# Patient Record
Sex: Male | Born: 1990 | Race: White | Hispanic: No | Marital: Single | Smoking: Current every day smoker
Health system: Southern US, Community
[De-identification: ages and names within clinical notes are randomized; demographics above are authoritative.]

## PROBLEM LIST (undated history)

## (undated) DIAGNOSIS — F419 Anxiety disorder, unspecified: Secondary | ICD-10-CM

## (undated) DIAGNOSIS — F410 Panic disorder [episodic paroxysmal anxiety] without agoraphobia: Secondary | ICD-10-CM

## (undated) DIAGNOSIS — F4310 Post-traumatic stress disorder, unspecified: Secondary | ICD-10-CM

## (undated) HISTORY — PX: ABDOMINAL SURGERY: SHX537

## (undated) HISTORY — PX: OTHER SURGICAL HISTORY: SHX169

## (undated) HISTORY — PX: NOSE SURGERY: SHX723

---

## 2008-05-30 ENCOUNTER — Inpatient Hospital Stay (HOSPITAL_COMMUNITY): Admission: EM | Admit: 2008-05-30 | Discharge: 2008-06-04 | Payer: Self-pay | Admitting: Emergency Medicine

## 2009-02-13 ENCOUNTER — Ambulatory Visit: Payer: Self-pay | Admitting: Interventional Radiology

## 2009-02-13 ENCOUNTER — Emergency Department (HOSPITAL_BASED_OUTPATIENT_CLINIC_OR_DEPARTMENT_OTHER): Admission: EM | Admit: 2009-02-13 | Discharge: 2009-02-13 | Payer: Self-pay | Admitting: Emergency Medicine

## 2009-05-03 ENCOUNTER — Emergency Department (HOSPITAL_COMMUNITY): Admission: EM | Admit: 2009-05-03 | Discharge: 2009-05-03 | Payer: Self-pay | Admitting: Emergency Medicine

## 2009-05-05 ENCOUNTER — Observation Stay (HOSPITAL_COMMUNITY): Admission: EM | Admit: 2009-05-05 | Discharge: 2009-05-06 | Payer: Self-pay | Admitting: Emergency Medicine

## 2009-09-30 ENCOUNTER — Emergency Department (HOSPITAL_COMMUNITY): Admission: EM | Admit: 2009-09-30 | Discharge: 2009-09-30 | Payer: Self-pay | Admitting: Emergency Medicine

## 2010-01-25 ENCOUNTER — Emergency Department (HOSPITAL_COMMUNITY)
Admission: EM | Admit: 2010-01-25 | Discharge: 2010-01-25 | Payer: Self-pay | Source: Home / Self Care | Admitting: Emergency Medicine

## 2010-03-04 ENCOUNTER — Emergency Department (HOSPITAL_COMMUNITY)
Admission: EM | Admit: 2010-03-04 | Discharge: 2010-03-04 | Disposition: A | Discharge status: Home / Self Care | Payer: Self-pay | Attending: Emergency Medicine | Admitting: Emergency Medicine

## 2010-03-04 DIAGNOSIS — K047 Periapical abscess without sinus: Secondary | ICD-10-CM | POA: Insufficient documentation

## 2010-03-04 DIAGNOSIS — K089 Disorder of teeth and supporting structures, unspecified: Secondary | ICD-10-CM | POA: Insufficient documentation

## 2010-03-24 LAB — CBC
MCHC: 35.4 g/dL (ref 30.0–36.0)
MCV: 92.4 fL (ref 78.0–100.0)
RDW: 13.1 % (ref 11.5–15.5)
RDW: 13.5 % (ref 11.5–15.5)
WBC: 5.3 10*3/uL (ref 4.0–10.5)
WBC: 8 10*3/uL (ref 4.0–10.5)

## 2010-03-24 LAB — DIFFERENTIAL
Basophils Absolute: 0 10*3/uL (ref 0.0–0.1)
Basophils Relative: 1 % (ref 0–1)
Basophils Relative: 0 % (ref 0–1)
Eosinophils Absolute: 0.1 10*3/uL (ref 0.0–0.7)
Eosinophils Relative: 2 % (ref 0–5)
Eosinophils Relative: 1 % (ref 0–5)
Lymphocytes Relative: 34 % (ref 12–46)
Monocytes Absolute: 0.6 10*3/uL (ref 0.1–1.0)
Monocytes Relative: 11 % (ref 3–12)
Neutrophils Relative %: 73 % (ref 43–77)

## 2010-03-24 LAB — BASIC METABOLIC PANEL
BUN: 12 mg/dL (ref 6–23)
Calcium: 9.4 mg/dL (ref 8.4–10.5)
Creatinine, Ser: 1.15 mg/dL (ref 0.4–1.5)
GFR calc non Af Amer: >60 mL/min (ref >60)
Potassium: 3.7 mEq/L (ref 3.5–5.1)
Sodium: 135 mEq/L (ref 135–145)

## 2010-04-13 LAB — CBC
Hemoglobin: 12.1 g/dL — ABNORMAL LOW (ref 13.0–17.0)
MCHC: 34.7 g/dL (ref 30.0–36.0)
MCV: 89.6 fL (ref 78.0–100.0)
RBC: 3.88 MIL/uL — ABNORMAL LOW (ref 4.22–5.81)
WBC: 6.7 10*3/uL (ref 4.0–10.5)

## 2010-04-14 LAB — CBC
HCT: 45 % (ref 39.0–52.0)
Hemoglobin: 14.7 g/dL (ref 13.0–17.0)
MCHC: 34.5 g/dL (ref 30.0–36.0)
MCV: 89.9 fL (ref 78.0–100.0)
RBC: 5.01 MIL/uL (ref 4.22–5.81)
RDW: 12.2 % (ref 11.5–15.5)
RDW: 12.6 % (ref 11.5–15.5)
WBC: 9.9 10*3/uL (ref 4.0–10.5)

## 2010-04-14 LAB — COMPREHENSIVE METABOLIC PANEL
ALT: 37 U/L (ref 0–53)
AST: 48 U/L — ABNORMAL HIGH (ref 0–37)
Alkaline Phosphatase: 87 U/L (ref 39–117)
Calcium: 9.2 mg/dL (ref 8.4–10.5)
GFR calc non Af Amer: >60 mL/min (ref >60)
Glucose, Bld: 145 mg/dL — ABNORMAL HIGH (ref 70–99)
Potassium: 3.2 mEq/L — ABNORMAL LOW (ref 3.5–5.1)
Total Protein: 6.6 g/dL (ref 6.0–8.3)

## 2010-04-14 LAB — DIFFERENTIAL
Basophils Absolute: 0.1 10*3/uL (ref 0.0–0.1)
Eosinophils Absolute: 0.2 10*3/uL (ref 0.0–0.7)
Lymphocytes Relative: 54 % — ABNORMAL HIGH (ref 12–46)
Monocytes Relative: 9 % (ref 3–12)
Neutro Abs: 3.5 10*3/uL (ref 1.7–7.7)
Neutrophils Relative %: 35 % — ABNORMAL LOW (ref 43–77)

## 2010-04-14 LAB — ABO/RH: ABO/RH(D): O POS

## 2010-04-14 LAB — POCT I-STAT, CHEM 8
Chloride: 104 mEq/L (ref 96–112)
Glucose, Bld: 144 mg/dL — ABNORMAL HIGH (ref 70–99)
Sodium: 141 mEq/L (ref 135–145)

## 2010-04-14 LAB — BASIC METABOLIC PANEL
CO2: 27 mEq/L (ref 19–32)
Calcium: 9.4 mg/dL (ref 8.4–10.5)
Creatinine, Ser: 0.78 mg/dL (ref 0.4–1.5)
GFR calc Af Amer: >60 mL/min (ref >60)
GFR calc non Af Amer: >60 mL/min (ref >60)
Glucose, Bld: 110 mg/dL — ABNORMAL HIGH (ref 70–99)
Sodium: 135 mEq/L (ref 135–145)

## 2010-04-14 LAB — CROSSMATCH
ABO/RH(D): O POS
Antibody Screen: NEGATIVE

## 2010-05-05 ENCOUNTER — Emergency Department (HOSPITAL_COMMUNITY)
Admission: EM | Admit: 2010-05-05 | Discharge: 2010-05-05 | Disposition: A | Discharge status: Home / Self Care | Payer: Self-pay | Attending: Emergency Medicine | Admitting: Emergency Medicine

## 2010-05-05 DIAGNOSIS — M25519 Pain in unspecified shoulder: Secondary | ICD-10-CM | POA: Insufficient documentation
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
## 2010-06-19 ENCOUNTER — Emergency Department (HOSPITAL_COMMUNITY)
Admission: EM | Admit: 2010-06-19 | Discharge: 2010-06-19 | Disposition: A | Discharge status: Home / Self Care | Payer: Self-pay | Attending: Emergency Medicine | Admitting: Emergency Medicine

## 2010-06-19 DIAGNOSIS — T2102XA Burn of unspecified degree of abdominal wall, initial encounter: Secondary | ICD-10-CM | POA: Insufficient documentation

## 2010-06-19 DIAGNOSIS — T23079A Burn of unspecified degree of unspecified wrist, initial encounter: Secondary | ICD-10-CM | POA: Insufficient documentation

## 2010-06-19 DIAGNOSIS — X12XXXA Contact with other hot fluids, initial encounter: Secondary | ICD-10-CM | POA: Insufficient documentation

## 2010-09-12 ENCOUNTER — Emergency Department (HOSPITAL_COMMUNITY)
Admission: EM | Admit: 2010-09-12 | Discharge: 2010-09-12 | Disposition: A | Discharge status: Home / Self Care | Payer: Self-pay | Attending: Emergency Medicine | Admitting: Emergency Medicine

## 2010-09-12 ENCOUNTER — Encounter: Payer: Self-pay | Admitting: *Deleted

## 2010-09-12 DIAGNOSIS — S161XXA Strain of muscle, fascia and tendon at neck level, initial encounter: Secondary | ICD-10-CM

## 2010-09-12 DIAGNOSIS — M542 Cervicalgia: Secondary | ICD-10-CM | POA: Insufficient documentation

## 2010-09-12 MED ORDER — IBUPROFEN 400 MG PO TABS
600.0000 mg | ORAL_TABLET | Freq: Once | ORAL | Status: AC
  Administered 2010-09-12: 600 mg via ORAL
  Filled 2010-09-12: qty 2

## 2010-09-12 MED ORDER — NAPROXEN 500 MG PO TABS: 500.0000 mg | ORAL_TABLET | Freq: Two times a day (BID) | ORAL | Status: DC

## 2010-09-12 MED ORDER — NAPROXEN 250 MG PO TABS: 500.0000 mg | ORAL_TABLET | Freq: Once | ORAL | Status: DC

## 2010-09-12 NOTE — ED Provider Notes (Signed)
History     CSN: [id number] Arrival date & time: 09/12/2010  1:21 AM  Chief Complaint  Patient presents with  . Neck Pain   Patient is a 20 y.o. male presenting with neck pain. The history is provided by the patient.  Neck Pain  This is a new problem. The current episode started 2 days ago. The problem occurs constantly. The problem has not changed since onset.The pain is associated with an unknown factor. There has been no fever. The pain is present in the generalized neck. The quality of the pain is described as aching. The pain does not radiate. The pain is at a severity of 8/10. The pain is moderate. The symptoms are aggravated by position. The pain is worse during the night. Pertinent negatives include no chest pain, no numbness, no headaches, no paresis, no tingling and no weakness.    History reviewed. No pertinent past medical history.  Past Surgical History  Procedure Date  . Abdominal surgery   . Nose surgery     History reviewed. No pertinent family history.  History  Substance Use Topics  . Smoking status: Current Everyday Smoker  . Smokeless tobacco: Not on file  . Alcohol Use: No      Review of Systems  Constitutional: Negative for fever.  HENT: Positive for neck pain. Negative for tinnitus.   Eyes: Negative for pain.  Respiratory: Negative for shortness of breath.   Cardiovascular: Negative for chest pain.  Gastrointestinal: Negative for abdominal pain.  Genitourinary: Negative for hematuria.  Musculoskeletal: Negative for myalgias, back pain and arthralgias.  Skin: Negative for rash.  Neurological: Negative for tingling, weakness, numbness and headaches.  Hematological: Negative for adenopathy.    Physical Exam  BP 120/79  Pulse 77  Temp(Src) 98.3 F (36.8 C) (Oral)  Resp 18  Ht 5\' 10"  (1.778 m)  Wt 140 lb (63.504 kg)  BMI 20.09 kg/m2  SpO2 99%  Physical Exam  Nursing note and vitals reviewed. Constitutional: He is oriented to person, place,  and time. He appears well-developed and well-nourished. No distress.  HENT:  Head: Normocephalic and atraumatic.  Mouth/Throat: Oropharynx is clear and moist.  Eyes: Conjunctivae and EOM are normal. Pupils are equal, round, and reactive to light.  Neck: Normal range of motion. Neck supple. No tracheal deviation present.  Cardiovascular: Normal rate, regular rhythm and normal heart sounds.   Pulmonary/Chest: Effort normal and breath sounds normal.  Abdominal: Soft. Bowel sounds are normal.  Musculoskeletal: Normal range of motion.  Lymphadenopathy:    He has no cervical adenopathy.  Neurological: He is alert and oriented to person, place, and time. No cranial nerve deficit.  Skin: Skin is warm. No rash noted.    ED Course  Procedures  MDM SUSPECT MILD MUSCLE STRAIN NO RECENT INJURY. PAIN MOSTLY POST NECK BIALT.       Shelda Jakes, MD 09/12/10 [phone]

## 2010-09-12 NOTE — ED Notes (Signed)
Pt c/o neck pain; pt denies any injury; pt c/o nausea

## 2010-10-09 ENCOUNTER — Encounter (HOSPITAL_COMMUNITY): Payer: Self-pay | Admitting: *Deleted

## 2010-10-09 ENCOUNTER — Emergency Department (HOSPITAL_COMMUNITY)
Admission: EM | Admit: 2010-10-09 | Discharge: 2010-10-09 | Disposition: A | Discharge status: Home / Self Care | Payer: Self-pay | Attending: Emergency Medicine | Admitting: Emergency Medicine

## 2010-10-09 DIAGNOSIS — R0602 Shortness of breath: Secondary | ICD-10-CM | POA: Insufficient documentation

## 2010-10-09 DIAGNOSIS — F4310 Post-traumatic stress disorder, unspecified: Secondary | ICD-10-CM | POA: Insufficient documentation

## 2010-10-09 DIAGNOSIS — F17200 Nicotine dependence, unspecified, uncomplicated: Secondary | ICD-10-CM | POA: Insufficient documentation

## 2010-10-09 DIAGNOSIS — F410 Panic disorder [episodic paroxysmal anxiety] without agoraphobia: Secondary | ICD-10-CM | POA: Insufficient documentation

## 2010-10-09 HISTORY — DX: Post-traumatic stress disorder, unspecified: F43.10

## 2010-10-09 HISTORY — DX: Anxiety disorder, unspecified: F41.9

## 2010-10-09 HISTORY — DX: Panic disorder (episodic paroxysmal anxiety): F41.0
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
## 2010-10-09 NOTE — ED Notes (Signed)
Pt also feels jittery.

## 2010-10-09 NOTE — ED Notes (Signed)
Pt states he is feeling better at this time & ready to go. EDP notified & advised he will get the discharge papers.
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
## 2011-01-08 ENCOUNTER — Encounter (HOSPITAL_COMMUNITY): Payer: Self-pay | Admitting: *Deleted

## 2011-01-08 ENCOUNTER — Emergency Department (HOSPITAL_COMMUNITY): Payer: Self-pay

## 2011-01-08 ENCOUNTER — Emergency Department (HOSPITAL_COMMUNITY)
Admission: EM | Admit: 2011-01-08 | Discharge: 2011-01-08 | Disposition: A | Discharge status: Home / Self Care | Payer: Self-pay | Attending: Emergency Medicine | Admitting: Emergency Medicine

## 2011-01-08 DIAGNOSIS — W19XXXA Unspecified fall, initial encounter: Secondary | ICD-10-CM

## 2011-01-08 DIAGNOSIS — M542 Cervicalgia: Secondary | ICD-10-CM | POA: Insufficient documentation

## 2011-01-08 DIAGNOSIS — Z9889 Other specified postprocedural states: Secondary | ICD-10-CM | POA: Insufficient documentation

## 2011-01-08 DIAGNOSIS — M545 Low back pain, unspecified: Secondary | ICD-10-CM | POA: Insufficient documentation

## 2011-01-08 DIAGNOSIS — F17200 Nicotine dependence, unspecified, uncomplicated: Secondary | ICD-10-CM | POA: Insufficient documentation

## 2011-01-08 DIAGNOSIS — R51 Headache: Secondary | ICD-10-CM | POA: Insufficient documentation

## 2011-01-08 DIAGNOSIS — Y9329 Activity, other involving ice and snow: Secondary | ICD-10-CM | POA: Insufficient documentation

## 2011-01-08 DIAGNOSIS — R110 Nausea: Secondary | ICD-10-CM | POA: Insufficient documentation

## 2011-01-08 DIAGNOSIS — M5380 Other specified dorsopathies, site unspecified: Secondary | ICD-10-CM | POA: Insufficient documentation

## 2011-01-08 DIAGNOSIS — IMO0001 Reserved for inherently not codable concepts without codable children: Secondary | ICD-10-CM | POA: Insufficient documentation

## 2011-01-08 DIAGNOSIS — R269 Unspecified abnormalities of gait and mobility: Secondary | ICD-10-CM | POA: Insufficient documentation

## 2011-01-08 DIAGNOSIS — M62838 Other muscle spasm: Secondary | ICD-10-CM | POA: Insufficient documentation

## 2011-01-08 DIAGNOSIS — W108XXA Fall (on) (from) other stairs and steps, initial encounter: Secondary | ICD-10-CM | POA: Insufficient documentation

## 2011-01-08 DIAGNOSIS — M546 Pain in thoracic spine: Secondary | ICD-10-CM | POA: Insufficient documentation

## 2011-01-08 DIAGNOSIS — F411 Generalized anxiety disorder: Secondary | ICD-10-CM | POA: Insufficient documentation

## 2011-01-08 DIAGNOSIS — M62830 Muscle spasm of back: Secondary | ICD-10-CM

## 2011-01-08 MED ORDER — IBUPROFEN 800 MG PO TABS: 800.0000 mg | ORAL_TABLET | Freq: Once | ORAL | Status: DC

## 2011-01-08 MED ORDER — IBUPROFEN 800 MG PO TABS
800.0000 mg | ORAL_TABLET | Freq: Once | ORAL | Status: DC
  Filled 2011-01-08: qty 1

## 2011-01-08 MED ORDER — CYCLOBENZAPRINE HCL 10 MG PO TABS: 10.0000 mg | ORAL_TABLET | Freq: Two times a day (BID) | ORAL | Status: AC | PRN

## 2011-01-08 NOTE — ED Notes (Addendum)
Slipped on ice coming down outdoor steps. C/o neck, back and tailbone pain. c-collar applied in triage. Also mentions HA & nausea. (Denies: other pains, injuries or sx). Denies LOC or vomiting. No meds PTA. Also states, "i have a h/o chronic neck and back issues & pain, but have been unable to get them checked out".

## 2011-01-08 NOTE — ED Provider Notes (Signed)
Patient reports he slipped and fell down approximately 3-4 steps last night at approximately 10:30 or 11:00 prior to arrival. Patient now complaining of bony tenderness to C-spine, T-spine, and L-spine. Patient denies drug or alcohol use prior to fall. Moves all extremities x4, denies numbness or tingling.  There are no visible signs of trauma to patient's back. Will obtain imaging of C-spine, T-spine and L-spine and reevaluate. B BS CTA, abdomen soft and nontender without signs of trauma.  Leanne Chang, NP 01/08/11 [phone]  Medical screening examination/treatment/procedure(s) were performed by non-physician practitioner and as supervising physician I was immediately available for consultation/collaboration.   Sunnie Nielsen, MD 01/12/11 0930
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
## 2011-01-13 ENCOUNTER — Encounter (HOSPITAL_COMMUNITY): Payer: Self-pay | Admitting: *Deleted

## 2011-01-13 ENCOUNTER — Emergency Department (HOSPITAL_COMMUNITY)
Admission: EM | Admit: 2011-01-13 | Discharge: 2011-01-13 | Disposition: A | Discharge status: Home / Self Care | Payer: Self-pay | Attending: Emergency Medicine | Admitting: Emergency Medicine

## 2011-01-13 DIAGNOSIS — F17200 Nicotine dependence, unspecified, uncomplicated: Secondary | ICD-10-CM | POA: Insufficient documentation

## 2011-01-13 DIAGNOSIS — F410 Panic disorder [episodic paroxysmal anxiety] without agoraphobia: Secondary | ICD-10-CM | POA: Insufficient documentation

## 2011-01-13 DIAGNOSIS — F411 Generalized anxiety disorder: Secondary | ICD-10-CM | POA: Insufficient documentation

## 2011-01-13 DIAGNOSIS — K0889 Other specified disorders of teeth and supporting structures: Secondary | ICD-10-CM

## 2011-01-13 DIAGNOSIS — K029 Dental caries, unspecified: Secondary | ICD-10-CM | POA: Insufficient documentation

## 2011-01-13 DIAGNOSIS — R112 Nausea with vomiting, unspecified: Secondary | ICD-10-CM | POA: Insufficient documentation

## 2011-01-13 DIAGNOSIS — F4310 Post-traumatic stress disorder, unspecified: Secondary | ICD-10-CM | POA: Insufficient documentation

## 2011-01-13 DIAGNOSIS — K089 Disorder of teeth and supporting structures, unspecified: Secondary | ICD-10-CM | POA: Insufficient documentation

## 2011-01-13 MED ORDER — ONDANSETRON 8 MG PO TBDP
8.0000 mg | ORAL_TABLET | Freq: Once | ORAL | Status: AC
  Administered 2011-01-13: 8 mg via ORAL
  Filled 2011-01-13: qty 1

## 2011-01-13 MED ORDER — AMOXICILLIN 500 MG PO CAPS: 500.0000 mg | ORAL_CAPSULE | Freq: Three times a day (TID) | ORAL | Status: AC

## 2011-01-13 MED ORDER — HYDROCODONE-ACETAMINOPHEN 5-325 MG PO TABS: ORAL_TABLET | ORAL | Status: AC

## 2011-01-13 MED ORDER — HYDROCODONE-ACETAMINOPHEN 5-325 MG PO TABS
1.0000 | ORAL_TABLET | Freq: Once | ORAL | Status: AC
  Administered 2011-01-13: 1 via ORAL
  Filled 2011-01-13: qty 1

## 2011-01-13 MED ORDER — AMOXICILLIN 250 MG PO CAPS
500.0000 mg | ORAL_CAPSULE | Freq: Once | ORAL | Status: AC
  Administered 2011-01-13: 500 mg via ORAL
  Filled 2011-01-13: qty 2

## 2011-01-13 NOTE — ED Notes (Signed)
States he hasn't been able to keep anything down today

## 2011-01-13 NOTE — ED Notes (Signed)
Rt jaw pain, onset this am., sore throat.  Alert, NAD, visitor at bedside.

## 2011-01-13 NOTE — ED Provider Notes (Signed)
History     CSN: [id number]  Arrival date & time 01/13/11  2008   None     Chief Complaint  Patient presents with  . Dental Pain    (Consider location/radiation/quality/duration/timing/severity/associated sxs/prior treatment) HPI Comments: Patient c/o toothace for several days.  Pain became worse on the day of ED arrival.  [place] the pain has made him nauseated and reports few vomiting of vomiting but states he has been able to keep down some fluids today.  He denies facial swelling or abdominal pain  Patient is a 21 y.o. male presenting with tooth pain. The history is provided by the patient.  Dental PainThe primary symptoms include mouth pain. Primary symptoms do not include oral bleeding, headaches, fever, shortness of breath, sore throat, angioedema or cough. The symptoms began 2 days ago. The symptoms are unchanged. The symptoms are new. The symptoms occur constantly.  Mouth pain occurs constantly. Mouth pain is worsening. Affected locations include: gum(s) and teeth. The mouth pain is currently at 9/10.  Additional symptoms include: dental sensitivity to temperature and gum tenderness. Additional symptoms do not include: gum swelling, purulent gums, trismus, jaw pain, facial swelling, trouble swallowing, pain with swallowing and drooling. Associated symptoms comments: Nausea and intermittent vomiting. Medical issues include: periodontal disease.    Past Medical History  Diagnosis Date  . Anxiety   . Panic attack   . PTSD (post-traumatic stress disorder)     Past Surgical History  Procedure Date  . Abdominal surgery   . Nose surgery     Family History  Problem Relation Age of Onset  . Diabetes Father   . Hypertension Father   . Diabetes Maternal Uncle     History  Substance Use Topics  . Smoking status: Current Everyday Smoker -- 1.0 packs/day  . Smokeless tobacco: Not on file  . Alcohol Use: Yes     occaisional      Review of Systems  Constitutional: Negative  for fever and chills.  HENT: Positive for dental problem. Negative for sore throat, facial swelling, drooling, trouble swallowing, neck pain and neck stiffness.   Respiratory: Negative for cough, shortness of breath and wheezing.   Cardiovascular: Negative for chest pain and palpitations.  Gastrointestinal: Positive for nausea and vomiting. Negative for abdominal pain and diarrhea.  Musculoskeletal: Negative for myalgias, back pain and arthralgias.  Skin: Negative.  Negative for rash.  Neurological: Negative for dizziness, weakness, numbness and headaches.  Hematological: Negative for adenopathy. Does not bruise/bleed easily.  All other systems reviewed and are negative.    Allergies  Codeine and Ibuprofen  Home Medications   Current Outpatient Rx  Name Route Sig Dispense Refill  . CYCLOBENZAPRINE HCL 10 MG PO TABS Oral Take 1 tablet (10 mg total) by mouth 2 (two) times daily as needed for muscle spasms. 20 tablet 0  . GOODY PM PO Oral Take 1 packet by mouth as needed. For dental pain    . IBUPROFEN 200 MG PO TABS Oral Take 200 mg by mouth 2 (two) times daily as needed. For pain      BP 110/65  Pulse 75  Temp 98.2 F (36.8 C)  Resp 20  Ht 5\' 10"  (1.778 m)  Wt 140 lb (63.504 kg)  BMI 20.09 kg/m2  SpO2 97%  Physical Exam  Nursing note and vitals reviewed. Constitutional: He is oriented to person, place, and time. He appears well-developed and well-nourished. No distress.  HENT:  Head: Normocephalic and atraumatic.  Mouth/Throat: Uvula is midline,  oropharynx is clear and moist and mucous membranes are normal. Dental caries present.  Neck: Normal range of motion.  Cardiovascular: Normal rate, regular rhythm and normal heart sounds.   Pulmonary/Chest: Effort normal and breath sounds normal. No respiratory distress.  Abdominal: Soft. He exhibits no distension. There is no tenderness.  Musculoskeletal: Normal range of motion. He exhibits no tenderness.  Lymphadenopathy:    He  has no cervical adenopathy.  Neurological: He is alert and oriented to person, place, and time. No cranial nerve deficit. He exhibits normal muscle tone. Coordination normal.  Skin: Skin is warm and dry.    ED Course  Procedures (including critical care time)  Labs Reviewed - No data to display No results found.   No diagnosis found.    MDM    Multiple dental caries.  Discoloration of the right lower third molar and moderate erythema and edema of the gums surrounding the upper right second and molar.  No trismus or facial swelling.  No abd tenderness on exam, mucous membranes are moist, no active vomiting.    Patient / Family / Caregiver understand and agree with initial ED impression and plan with expectations set for ED visit.   Pt stable in ED with no significant deterioration in condition.         Arriyah Madej L. Trisha Mangle, [place] 01/16/11 2014

## 2011-01-13 NOTE — ED Notes (Signed)
Dental pain right jaw 

## 2011-01-18 NOTE — ED Provider Notes (Signed)
Medical screening examination/treatment/procedure(s) were performed by non-physician practitioner and as supervising physician I was immediately available for consultation/collaboration.   Benny Lennert, MD 01/18/11 [phone]

## 2011-03-06 ENCOUNTER — Emergency Department (HOSPITAL_COMMUNITY)
Admission: EM | Admit: 2011-03-06 | Discharge: 2011-03-06 | Disposition: A | Discharge status: Home Health | Payer: Self-pay | Attending: Emergency Medicine | Admitting: Emergency Medicine

## 2011-03-06 ENCOUNTER — Encounter (HOSPITAL_COMMUNITY): Payer: Self-pay | Admitting: *Deleted

## 2011-03-06 ENCOUNTER — Emergency Department (HOSPITAL_COMMUNITY): Payer: Self-pay

## 2011-03-06 DIAGNOSIS — F411 Generalized anxiety disorder: Secondary | ICD-10-CM | POA: Insufficient documentation

## 2011-03-06 DIAGNOSIS — R10819 Abdominal tenderness, unspecified site: Secondary | ICD-10-CM | POA: Insufficient documentation

## 2011-03-06 DIAGNOSIS — R109 Unspecified abdominal pain: Secondary | ICD-10-CM | POA: Insufficient documentation

## 2011-03-06 DIAGNOSIS — Z9889 Other specified postprocedural states: Secondary | ICD-10-CM | POA: Insufficient documentation

## 2011-03-06 DIAGNOSIS — F17200 Nicotine dependence, unspecified, uncomplicated: Secondary | ICD-10-CM | POA: Insufficient documentation

## 2011-03-06 LAB — CBC
HCT: 39.4 % (ref 39.0–52.0)
Hemoglobin: 14.1 g/dL (ref 13.0–17.0)
MCH: 30.8 pg (ref 26.0–34.0)
MCHC: 35.8 g/dL (ref 30.0–36.0)
MCV: 86 fL (ref 78.0–100.0)
Platelets: 275 10*3/uL (ref 150–400)
RBC: 4.58 MIL/uL (ref 4.22–5.81)
RDW: 12.4 % (ref 11.5–15.5)
WBC: 10.4 10*3/uL (ref 4.0–10.5)

## 2011-03-06 LAB — COMPREHENSIVE METABOLIC PANEL
ALT: 14 U/L (ref 0–53)
AST: 19 U/L (ref 0–37)
Albumin: 4.2 g/dL (ref 3.5–5.2)
Alkaline Phosphatase: 75 U/L (ref 39–117)
BUN: 14 mg/dL (ref 6–23)
CO2: 26 mEq/L (ref 19–32)
Calcium: 9.9 mg/dL (ref 8.4–10.5)
Chloride: 104 mEq/L (ref 96–112)
Creatinine, Ser: 0.75 mg/dL (ref 0.50–1.35)
GFR calc Af Amer: >90 mL/min (ref >90)
GFR calc non Af Amer: >90 mL/min (ref >90)
Glucose, Bld: 86 mg/dL (ref 70–99)
Potassium: 4.2 mEq/L (ref 3.5–5.1)
Sodium: 140 mEq/L (ref 135–145)
Total Bilirubin: 0.9 mg/dL (ref 0.3–1.2)
Total Protein: 7.1 g/dL (ref 6.0–8.3)

## 2011-03-06 LAB — URINALYSIS, ROUTINE W REFLEX MICROSCOPIC
Bilirubin Urine: NEGATIVE
Glucose, UA: NEGATIVE mg/dL
Hgb urine dipstick: NEGATIVE
Ketones, ur: NEGATIVE mg/dL
Leukocytes, UA: NEGATIVE
Nitrite: NEGATIVE
Protein, ur: NEGATIVE mg/dL
Specific Gravity, Urine: 1.007 (ref 1.005–1.030)
Urobilinogen, UA: 0.2 mg/dL (ref 0.0–1.0)
pH: 6.5 (ref 5.0–8.0)

## 2011-03-06 LAB — LIPASE, BLOOD: Lipase: 21 U/L (ref 11–59)

## 2011-03-06 LAB — GLUCOSE, CAPILLARY: Glucose-Capillary: 112 mg/dL — ABNORMAL HIGH (ref 70–99)

## 2011-03-06 MED ORDER — IOHEXOL 300 MG/ML  SOLN
100.0000 mL | Freq: Once | INTRAMUSCULAR | Status: AC | PRN
  Administered 2011-03-06: 100 mL via INTRAVENOUS

## 2011-03-06 MED ORDER — SODIUM CHLORIDE 0.9 % IV BOLUS (SEPSIS)
1000.0000 mL | Freq: Once | INTRAVENOUS | Status: AC
  Administered 2011-03-06: 1000 mL via INTRAVENOUS

## 2011-03-06 MED ORDER — HYDROMORPHONE HCL PF 1 MG/ML IJ SOLN
1.0000 mg | Freq: Once | INTRAMUSCULAR | Status: AC
  Administered 2011-03-06: 1 mg via INTRAVENOUS
  Filled 2011-03-06: qty 1

## 2011-03-06 MED ORDER — IOHEXOL 300 MG/ML  SOLN: 20.0000 mL | INTRAMUSCULAR | Status: AC

## 2011-03-06 MED ORDER — ONDANSETRON HCL 4 MG/2ML IJ SOLN
4.0000 mg | Freq: Once | INTRAMUSCULAR | Status: AC
  Administered 2011-03-06: 4 mg via INTRAVENOUS
  Filled 2011-03-06: qty 2

## 2011-03-06 NOTE — ED Notes (Signed)
CBG was 112 when I checked it.

## 2011-03-06 NOTE — ED Notes (Signed)
Patient transported to CT 

## 2011-03-06 NOTE — ED Notes (Signed)
Pt began vomiting after receiving IV Dye contrast

## 2011-03-06 NOTE — Discharge Instructions (Signed)
Abdominal Pain Abdominal pain can be caused by many things. Your caregiver decides the seriousness of your pain by an examination and possibly blood tests and X-rays. Many cases can be observed and treated at home. Most abdominal pain is not caused by a disease and will probably improve without treatment. However, in many cases, more time must pass before a clear cause of the pain can be found. Before that point, it may not be known if you need more testing, or if hospitalization or surgery is needed. HOME CARE INSTRUCTIONS   Do not take laxatives unless directed by your caregiver.   Take pain medicine only as directed by your caregiver.   Only take over-the-counter or prescription medicines for pain, discomfort, or fever as directed by your caregiver.   Try a clear liquid diet (broth, tea, or water) for as long as directed by your caregiver. Slowly move to a bland diet as tolerated.  SEEK IMMEDIATE MEDICAL CARE IF:   The pain does not go away.   You have a fever.   You keep throwing up (vomiting).   The pain is felt only in portions of the abdomen. Pain in the right side could possibly be appendicitis. In an adult, pain in the left lower portion of the abdomen could be colitis or diverticulitis.   You pass bloody or black tarry stools.  MAKE SURE YOU:   Understand these instructions.   Will watch your condition.   Will get help right away if you are not doing well or get worse.  Document Released: 09/30/2004 Document Revised: 09/02/2010 Document Reviewed: 08/09/2007 [facility] Patient Information 2012 [place], [place].  RESOURCE GUIDE  Dental Problems  Patients with Medicaid: [facility] [phone] [address]                                           [phone] [address] Phone:  [phone]                                                  Phone:  [phone]  If unable to pay or uninsured, contact:  [facility] or [facility]. to become qualified for the adult dental clinic.  Chronic Pain Problems Contact [facility]  [phone] Patients need to be referred by their primary care doctor.  Insufficient Money for Medicine Contact [employer]:  call "211" or [facility] (629)593-3365.  No Primary Care Doctor Call [facility]  [phone] Other agencies that provide inexpensive medical care    [facility]  [phone]    [facility], [facility], [facility]  [phone]    [facility]  [phone]    [facility]  [phone]    [facility]  [phone]    [facility]  [phone]  Psychological Services [facility]  [phone] [facility]  [phone] [facility]   [phone] (emergency services 9257643961)  Substance Abuse Resources [facility]  ([phone] [facility] [phone] [facility] [place] [phone] Floydene Flock [phone] Residential & Outpatient Substance Abuse Program  [phone]  Abuse/Neglect [place] Child Abuse Hotline (336) 641-3795 [place] Child Abuse Hotline 800-378-5315 (After Hours)  Emergency Shelter [facility] (336) 271-5985  [facility] (336) [phone] [facility] [phone]) [phone]  MRSA Hotline #:   832-7006    [place] Resources  [facility] [place]     [employer]                          [place] [facility] [address] [place]                       [address]      [address]  [place]                                                [place]                            [place] Phone:  [phone]                                   Phone:  [phone]                 Phone:  [phone]  [place] [facility] Phone:  [phone]  [place] Child Abuse Hotline (336) 342-1394 (336) 342-3537 (After  Hours)   

## 2011-03-06 NOTE — ED Notes (Signed)
Pt drinking ct contrast 
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
## 2012-04-23 ENCOUNTER — Emergency Department (HOSPITAL_COMMUNITY): Payer: Self-pay

## 2012-04-23 ENCOUNTER — Emergency Department (HOSPITAL_COMMUNITY)
Admission: EM | Admit: 2012-04-23 | Discharge: 2012-04-23 | Disposition: A | Discharge status: Home / Self Care | Payer: Self-pay | Attending: Emergency Medicine | Admitting: Emergency Medicine

## 2012-04-23 ENCOUNTER — Encounter (HOSPITAL_COMMUNITY): Payer: Self-pay | Admitting: Cardiology

## 2012-04-23 DIAGNOSIS — F17200 Nicotine dependence, unspecified, uncomplicated: Secondary | ICD-10-CM | POA: Insufficient documentation

## 2012-04-23 DIAGNOSIS — F4310 Post-traumatic stress disorder, unspecified: Secondary | ICD-10-CM | POA: Insufficient documentation

## 2012-04-23 DIAGNOSIS — F411 Generalized anxiety disorder: Secondary | ICD-10-CM | POA: Insufficient documentation

## 2012-04-23 DIAGNOSIS — F410 Panic disorder [episodic paroxysmal anxiety] without agoraphobia: Secondary | ICD-10-CM | POA: Insufficient documentation

## 2012-04-23 DIAGNOSIS — R319 Hematuria, unspecified: Secondary | ICD-10-CM | POA: Insufficient documentation

## 2012-04-23 DIAGNOSIS — N200 Calculus of kidney: Secondary | ICD-10-CM | POA: Insufficient documentation

## 2012-04-23 LAB — COMPREHENSIVE METABOLIC PANEL
ALT: 27 U/L (ref 0–53)
AST: 25 U/L (ref 0–37)
Albumin: 4.4 g/dL (ref 3.5–5.2)
Alkaline Phosphatase: 82 U/L (ref 39–117)
Calcium: 9.6 mg/dL (ref 8.4–10.5)
GFR calc Af Amer: >90 mL/min (ref >90)
Glucose, Bld: 94 mg/dL (ref 70–99)
Potassium: 3.8 mEq/L (ref 3.5–5.1)
Sodium: 136 mEq/L (ref 135–145)
Total Protein: 7.8 g/dL (ref 6.0–8.3)

## 2012-04-23 LAB — CBC WITH DIFFERENTIAL/PLATELET
Basophils Absolute: 0 10*3/uL (ref 0.0–0.1)
Basophils Relative: 1 % (ref 0–1)
Eosinophils Absolute: 0.1 10*3/uL (ref 0.0–0.7)
Eosinophils Relative: 1 % (ref 0–5)
Lymphs Abs: 2.4 10*3/uL (ref 0.7–4.0)
MCH: 30.7 pg (ref 26.0–34.0)
MCV: 84.5 fL (ref 78.0–100.0)
Neutrophils Relative %: 64 % (ref 43–77)
Platelets: 246 10*3/uL (ref 150–400)
RBC: 5.21 MIL/uL (ref 4.22–5.81)
RDW: 12.7 % (ref 11.5–15.5)

## 2012-04-23 LAB — URINE MICROSCOPIC-ADD ON

## 2012-04-23 LAB — URINALYSIS, ROUTINE W REFLEX MICROSCOPIC
Bilirubin Urine: NEGATIVE
Specific Gravity, Urine: 1.007 (ref 1.005–1.030)
Urobilinogen, UA: 0.2 mg/dL (ref 0.0–1.0)
pH: 8 (ref 5.0–8.0)

## 2012-04-23 MED ORDER — ONDANSETRON HCL 4 MG/2ML IJ SOLN
4.0000 mg | Freq: Four times a day (QID) | INTRAMUSCULAR | Status: DC | PRN
  Administered 2012-04-23: 4 mg via INTRAVENOUS
  Filled 2012-04-23: qty 2

## 2012-04-23 MED ORDER — TAMSULOSIN HCL 0.4 MG PO CAPS: 0.4000 mg | ORAL_CAPSULE | Freq: Two times a day (BID) | ORAL | Status: DC

## 2012-04-23 MED ORDER — KETOROLAC TROMETHAMINE 30 MG/ML IJ SOLN
30.0000 mg | Freq: Four times a day (QID) | INTRAMUSCULAR | Status: DC | PRN
  Administered 2012-04-23: 30 mg via INTRAVENOUS
  Filled 2012-04-23: qty 1

## 2012-04-23 MED ORDER — NAPROXEN 500 MG PO TABS: 500.0000 mg | ORAL_TABLET | Freq: Two times a day (BID) | ORAL | Status: DC

## 2012-04-23 NOTE — ED Notes (Signed)
Dr Miller at bedside. 

## 2012-04-23 NOTE — ED Notes (Signed)
Pt taken to radiology
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
## 2012-04-23 NOTE — ED Notes (Signed)
Pt alert and mentating appropriately upon d/c. Pt given strainer and urinal for straining urine. Pt instructed how to use. Pt leaving with d/c teaching and prescriptions. Pt verbalizes d/c teaching and prescriptions. Pt has no further questions upon d/c. NAD noted upon d/c. Pt ambulatory upon d/c.
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
## 2013-01-25 ENCOUNTER — Encounter (HOSPITAL_COMMUNITY): Payer: Self-pay | Admitting: Emergency Medicine

## 2013-01-25 ENCOUNTER — Emergency Department (HOSPITAL_COMMUNITY)
Admission: EM | Admit: 2013-01-25 | Discharge: 2013-01-25 | Discharge status: Against Medical Advice | Payer: Self-pay | Attending: Emergency Medicine | Admitting: Emergency Medicine

## 2013-01-25 DIAGNOSIS — R070 Pain in throat: Secondary | ICD-10-CM | POA: Insufficient documentation

## 2013-01-25 DIAGNOSIS — F17200 Nicotine dependence, unspecified, uncomplicated: Secondary | ICD-10-CM | POA: Insufficient documentation

## 2013-01-25 DIAGNOSIS — R059 Cough, unspecified: Secondary | ICD-10-CM | POA: Insufficient documentation

## 2013-01-25 DIAGNOSIS — H9209 Otalgia, unspecified ear: Secondary | ICD-10-CM | POA: Insufficient documentation

## 2013-01-25 DIAGNOSIS — R05 Cough: Secondary | ICD-10-CM | POA: Insufficient documentation

## 2013-01-25 NOTE — ED Notes (Signed)
Pt. Left prior to being seen by PA.

## 2013-01-25 NOTE — ED Notes (Addendum)
Generalized cold symtpoms- ear, throat pain. Cough with yellow mucous in morning; having symptoms for a week.

## 2013-08-21 ENCOUNTER — Encounter (HOSPITAL_COMMUNITY): Payer: Self-pay | Admitting: Emergency Medicine

## 2013-08-21 ENCOUNTER — Emergency Department (HOSPITAL_COMMUNITY): Payer: Self-pay

## 2013-08-21 ENCOUNTER — Emergency Department (HOSPITAL_COMMUNITY)
Admission: EM | Admit: 2013-08-21 | Discharge: 2013-08-21 | Discharge status: Against Medical Advice | Payer: Self-pay | Attending: Emergency Medicine | Admitting: Emergency Medicine

## 2013-08-21 DIAGNOSIS — R079 Chest pain, unspecified: Secondary | ICD-10-CM | POA: Insufficient documentation

## 2013-08-21 DIAGNOSIS — R0789 Other chest pain: Secondary | ICD-10-CM | POA: Insufficient documentation

## 2013-08-21 DIAGNOSIS — F17200 Nicotine dependence, unspecified, uncomplicated: Secondary | ICD-10-CM | POA: Insufficient documentation

## 2013-08-21 DIAGNOSIS — F411 Generalized anxiety disorder: Secondary | ICD-10-CM | POA: Insufficient documentation

## 2013-08-21 DIAGNOSIS — F419 Anxiety disorder, unspecified: Secondary | ICD-10-CM

## 2013-08-21 LAB — CBC
HEMATOCRIT: 44.4 % (ref 39.0–52.0)
HEMOGLOBIN: 15.7 g/dL (ref 13.0–17.0)
MCH: 30.7 pg (ref 26.0–34.0)
MCHC: 35.4 g/dL (ref 30.0–36.0)
MCV: 86.9 fL (ref 78.0–100.0)
Platelets: 245 10*3/uL (ref 150–400)
RBC: 5.11 MIL/uL (ref 4.22–5.81)
RDW: 13.3 % (ref 11.5–15.5)
WBC: 8.7 10*3/uL (ref 4.0–10.5)

## 2013-08-21 LAB — COMPREHENSIVE METABOLIC PANEL
ALK PHOS: 84 U/L (ref 39–117)
ALT: 36 U/L (ref 0–53)
ANION GAP: 13 (ref 5–15)
AST: 22 U/L (ref 0–37)
Albumin: 4.5 g/dL (ref 3.5–5.2)
BILIRUBIN TOTAL: 1.8 mg/dL — AB (ref 0.3–1.2)
BUN: 10 mg/dL (ref 6–23)
CO2: 27 meq/L (ref 19–32)
Calcium: 9.7 mg/dL (ref 8.4–10.5)
Chloride: 101 mEq/L (ref 96–112)
Creatinine, Ser: 1.09 mg/dL (ref 0.50–1.35)
GLUCOSE: 105 mg/dL — AB (ref 70–99)
POTASSIUM: 3.7 meq/L (ref 3.7–5.3)
Sodium: 141 mEq/L (ref 137–147)
TOTAL PROTEIN: 7.9 g/dL (ref 6.0–8.3)

## 2013-08-21 LAB — I-STAT TROPONIN, ED: Troponin i, poc: 0 ng/mL (ref 0.00–0.08)
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
## 2013-08-21 NOTE — ED Notes (Signed)
The pt is tired of waiting to be seen.  He has rib pain now not  acutely

## 2013-08-21 NOTE — ED Notes (Signed)
Cp x 1 hour states feels tight states had a panic atack the last time he had chest pain no n/v some sob states hand and legs are numb

## 2013-08-21 NOTE — ED Notes (Signed)
Pt not in the room everything gown etc on the bed  No one there

## 2013-08-21 NOTE — ED Notes (Signed)
The pt was working when he developed chest tightness with sob then in a few minutes he developed arm hand and feet tingling.  He has had an amxiety attack years ago.  Not hyperventilating at present

## 2013-08-23 NOTE — ED Provider Notes (Signed)
Medical screening examination/treatment/procedure(s) were conducted as a shared visit with resident-physician practitioner(s) and myself.  I personally evaluated the patient during the encounter.  Pt is a 23 y.o. male with pmhx as above presenting with atypical chest pain and anxiety.  Pt found to have no acute ischemic changes on EKG.  Pt eloped from dept before could examine him.     EKG Interpretation  Date/Time:  Tuesday August 21 2013 14:07:00 EDT Ventricular Rate:  81 PR Interval:  124 QRS Duration: 80 QT Interval:  358 QTC Calculation: 415 R Axis:   112 Text Interpretation:  Normal sinus rhythm Right axis deviation Abnormal ECG No prior EKG Confirmed by DOCHERTY  MD, MEGAN ([id number]) on 08/21/2013 5:50:06 PM        Toy CookeyMegan Docherty, MD 08/23/13 1120

## 2013-08-25 ENCOUNTER — Emergency Department (HOSPITAL_COMMUNITY)
Admission: EM | Admit: 2013-08-25 | Discharge: 2013-08-25 | Disposition: A | Discharge status: Home / Self Care | Payer: No Typology Code available for payment source | Attending: Emergency Medicine | Admitting: Emergency Medicine

## 2013-08-25 ENCOUNTER — Encounter (HOSPITAL_COMMUNITY): Payer: Self-pay | Admitting: Emergency Medicine

## 2013-08-25 ENCOUNTER — Emergency Department (HOSPITAL_COMMUNITY): Payer: No Typology Code available for payment source

## 2013-08-25 DIAGNOSIS — R42 Dizziness and giddiness: Secondary | ICD-10-CM | POA: Diagnosis not present

## 2013-08-25 DIAGNOSIS — IMO0002 Reserved for concepts with insufficient information to code with codable children: Secondary | ICD-10-CM | POA: Insufficient documentation

## 2013-08-25 DIAGNOSIS — M549 Dorsalgia, unspecified: Secondary | ICD-10-CM

## 2013-08-25 DIAGNOSIS — S01409A Unspecified open wound of unspecified cheek and temporomandibular area, initial encounter: Secondary | ICD-10-CM | POA: Insufficient documentation

## 2013-08-25 DIAGNOSIS — F17200 Nicotine dependence, unspecified, uncomplicated: Secondary | ICD-10-CM | POA: Insufficient documentation

## 2013-08-25 DIAGNOSIS — Y92410 Unspecified street and highway as the place of occurrence of the external cause: Secondary | ICD-10-CM | POA: Insufficient documentation

## 2013-08-25 DIAGNOSIS — S0010XA Contusion of unspecified eyelid and periocular area, initial encounter: Secondary | ICD-10-CM | POA: Diagnosis not present

## 2013-08-25 DIAGNOSIS — S0181XA Laceration without foreign body of other part of head, initial encounter: Secondary | ICD-10-CM

## 2013-08-25 DIAGNOSIS — Y9389 Activity, other specified: Secondary | ICD-10-CM | POA: Diagnosis not present

## 2013-08-25 DIAGNOSIS — S0993XA Unspecified injury of face, initial encounter: Secondary | ICD-10-CM | POA: Insufficient documentation

## 2013-08-25 DIAGNOSIS — S0083XA Contusion of other part of head, initial encounter: Secondary | ICD-10-CM

## 2013-08-25 DIAGNOSIS — S199XXA Unspecified injury of neck, initial encounter: Secondary | ICD-10-CM

## 2013-08-25 DIAGNOSIS — M542 Cervicalgia: Secondary | ICD-10-CM

## 2013-08-25 DIAGNOSIS — Z8659 Personal history of other mental and behavioral disorders: Secondary | ICD-10-CM | POA: Diagnosis not present

## 2013-08-25 MED ORDER — FAMOTIDINE 20 MG PO TABS: 40.0000 mg | ORAL_TABLET | Freq: Every day | ORAL | Status: DC

## 2013-08-25 MED ORDER — NAPROXEN 250 MG PO TABS
500.0000 mg | ORAL_TABLET | Freq: Once | ORAL | Status: AC
  Administered 2013-08-25: 500 mg via ORAL
  Filled 2013-08-25: qty 2

## 2013-08-25 MED ORDER — CYCLOBENZAPRINE HCL 5 MG PO TABS: 5.0000 mg | ORAL_TABLET | Freq: Three times a day (TID) | ORAL | Status: DC | PRN

## 2013-08-25 MED ORDER — FAMOTIDINE 20 MG PO TABS
20.0000 mg | ORAL_TABLET | Freq: Once | ORAL | Status: AC
  Administered 2013-08-25: 20 mg via ORAL
  Filled 2013-08-25: qty 1

## 2013-08-25 MED ORDER — NAPROXEN 500 MG PO TABS: 500.0000 mg | ORAL_TABLET | Freq: Two times a day (BID) | ORAL | Status: DC

## 2013-08-25 MED ORDER — CYCLOBENZAPRINE HCL 10 MG PO TABS
10.0000 mg | ORAL_TABLET | Freq: Once | ORAL | Status: AC
  Administered 2013-08-25: 10 mg via ORAL
  Filled 2013-08-25: qty 1

## 2013-08-25 MED ORDER — LIDOCAINE-EPINEPHRINE-TETRACAINE (LET) SOLUTION
3.0000 mL | Freq: Once | NASAL | Status: AC
  Administered 2013-08-25: 3 mL via TOPICAL
  Filled 2013-08-25: qty 3

## 2013-08-25 MED ORDER — TRAMADOL HCL 50 MG PO TABS: 100.0000 mg | ORAL_TABLET | Freq: Four times a day (QID) | ORAL | Status: DC | PRN

## 2013-08-25 NOTE — ED Notes (Signed)
Patient with no complaints at this time. Respirations even and unlabored. Skin warm/dry. Discharge instructions reviewed with patient at this time. Patient given opportunity to voice concerns/ask questions. Patient discharged at this time and left Emergency Department with steady gait.   
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
## 2013-08-25 NOTE — Discharge Instructions (Signed)
Ice packs to the injured or sore muscles for pain and bruising and use heat (shower) to relax your sore muscles.  Take the medications for pain and muscle spasms. Keep soap and ointments off the dermabond (glue) on your laceration or the glue will dissolve too quickly. Return to the ED for any problems listed on the head injury sheet. Recheck if you aren't improving in the next week. You can be rechecked by your mother's orthopedist, Dr Shon BatonBrooks.

## 2013-08-25 NOTE — ED Notes (Signed)
Laceration below left eye cleaned with sterile normal saline. LET applied per MD order

## 2013-08-25 NOTE — ED Notes (Signed)
Patient log rolled off backboard using spinal precautions. No step offs noted on palpation. C/o cervical and lumbar tenderness with palpation. C-collar remains in place.

## 2013-08-27 ENCOUNTER — Telehealth (HOSPITAL_BASED_OUTPATIENT_CLINIC_OR_DEPARTMENT_OTHER): Payer: Self-pay | Admitting: Emergency Medicine

## 2013-10-20 ENCOUNTER — Emergency Department (HOSPITAL_COMMUNITY): Payer: No Typology Code available for payment source

## 2013-10-20 ENCOUNTER — Emergency Department (HOSPITAL_COMMUNITY)
Admission: EM | Admit: 2013-10-20 | Discharge: 2013-10-20 | Disposition: A | Discharge status: Home / Self Care | Payer: No Typology Code available for payment source | Attending: Emergency Medicine | Admitting: Emergency Medicine

## 2013-10-20 ENCOUNTER — Encounter (HOSPITAL_COMMUNITY): Payer: Self-pay | Admitting: Emergency Medicine

## 2013-10-20 DIAGNOSIS — J069 Acute upper respiratory infection, unspecified: Secondary | ICD-10-CM

## 2013-10-20 DIAGNOSIS — Z79899 Other long term (current) drug therapy: Secondary | ICD-10-CM | POA: Insufficient documentation

## 2013-10-20 DIAGNOSIS — F419 Anxiety disorder, unspecified: Secondary | ICD-10-CM | POA: Insufficient documentation

## 2013-10-20 DIAGNOSIS — Z720 Tobacco use: Secondary | ICD-10-CM | POA: Insufficient documentation

## 2013-10-20 MED ORDER — ALBUTEROL SULFATE HFA 108 (90 BASE) MCG/ACT IN AERS
2.0000 | INHALATION_SPRAY | RESPIRATORY_TRACT | Status: DC | PRN
  Administered 2013-10-20: 2 via RESPIRATORY_TRACT
  Filled 2013-10-20: qty 6.7

## 2013-10-20 MED ORDER — BENZONATATE 100 MG PO CAPS: 100.0000 mg | ORAL_CAPSULE | Freq: Three times a day (TID) | ORAL | Status: DC

## 2013-10-20 MED ORDER — HYDROCODONE-ACETAMINOPHEN 5-325 MG PO TABS: 1.0000 | ORAL_TABLET | Freq: Four times a day (QID) | ORAL | Status: DC | PRN

## 2013-10-20 MED ORDER — AZITHROMYCIN 250 MG PO TABS: 250.0000 mg | ORAL_TABLET | Freq: Every day | ORAL | Status: DC
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
## 2013-10-20 NOTE — ED Notes (Addendum)
Pt reports productive cough with "green, yellow, white and red" sputum x 1 week. Pt also reports generalized HA with blurred vision x 1 week. Denies taking anything for pain. Pt denies any SOB or CP. Lungs clear bilaterally.  Pt in NAD. AO x4.

## 2013-10-20 NOTE — ED Notes (Signed)
Patient given norco perscription.

## 2013-10-20 NOTE — ED Notes (Signed)
Patient requests pain medication, will clarify with Rob, PA-C.

## 2013-10-20 NOTE — ED Notes (Signed)
Called x-ray, patient is back in the waiting room. Notified nurse first.

## 2013-10-20 NOTE — ED Notes (Signed)
Rob, PA-C, is at the bedside.

## 2013-10-20 NOTE — ED Notes (Signed)
Patient not in room. Discussed with Rob, PA, that patient has x-ray ordered.

## 2013-10-20 NOTE — ED Notes (Signed)
Patient  Not in room

## 2013-10-20 NOTE — Discharge Instructions (Signed)
Upper Respiratory Infection, Adult An upper respiratory infection (URI) is also known as the common cold. It is often caused by a type of germ (virus). Colds are easily spread (contagious). You can pass it to others by kissing, coughing, sneezing, or drinking out of the same glass. Usually, you get better in 1 or 2 weeks.  HOME CARE   Only take medicine as told by your doctor.  Use a warm mist humidifier or breathe in steam from a hot shower.  Drink enough water and fluids to keep your pee (urine) clear or pale yellow.  Get plenty of rest.  Return to work when your temperature is back to normal or as told by your doctor. You may use a face mask and wash your hands to stop your cold from spreading. GET HELP RIGHT AWAY IF:   After the first few days, you feel you are getting worse.  You have questions about your medicine.  You have chills, shortness of breath, or brown or red spit (mucus).  You have yellow or brown snot (nasal discharge) or pain in the face, especially when you bend forward.  You have a fever, puffy (swollen) neck, pain when you swallow, or white spots in the back of your throat.  You have a bad headache, ear pain, sinus pain, or chest pain.  You have a high-pitched whistling sound when you breathe in and out (wheezing).  You have a lasting cough or cough up blood.  You have sore muscles or a stiff neck. MAKE SURE YOU:   Understand these instructions.  Will watch your condition.  Will get help right away if you are not doing well or get worse. Document Released: 06/09/2007 Document Revised: 03/15/2011 Document Reviewed: 03/28/2013 Exit[employer] Patient Information 2015 [employer]. This information is not intended to replace advice given to you by your health care provider. Make sure you discuss any questions you have with your health care provider.  

## 2013-10-21 NOTE — ED Provider Notes (Signed)
Medical screening examination/treatment/procedure(s) were performed by non-physician practitioner and as supervising physician I was immediately available for consultation/collaboration.   EKG Interpretation None        Purvis SheffieldForrest Averi Kilty, MD 10/21/13 0006

## 2014-02-08 ENCOUNTER — Emergency Department (HOSPITAL_COMMUNITY)
Admission: EM | Admit: 2014-02-08 | Discharge: 2014-02-08 | Disposition: A | Discharge status: Home / Self Care | Payer: No Typology Code available for payment source | Attending: Emergency Medicine | Admitting: Emergency Medicine

## 2014-02-08 ENCOUNTER — Encounter (HOSPITAL_COMMUNITY): Payer: Self-pay | Admitting: Family Medicine

## 2014-02-08 DIAGNOSIS — R945 Abnormal results of liver function studies: Secondary | ICD-10-CM

## 2014-02-08 DIAGNOSIS — R7989 Other specified abnormal findings of blood chemistry: Secondary | ICD-10-CM | POA: Insufficient documentation

## 2014-02-08 DIAGNOSIS — Z79899 Other long term (current) drug therapy: Secondary | ICD-10-CM | POA: Insufficient documentation

## 2014-02-08 DIAGNOSIS — F1410 Cocaine abuse, uncomplicated: Secondary | ICD-10-CM | POA: Insufficient documentation

## 2014-02-08 DIAGNOSIS — Z792 Long term (current) use of antibiotics: Secondary | ICD-10-CM | POA: Insufficient documentation

## 2014-02-08 DIAGNOSIS — Z8659 Personal history of other mental and behavioral disorders: Secondary | ICD-10-CM | POA: Insufficient documentation

## 2014-02-08 DIAGNOSIS — F1310 Sedative, hypnotic or anxiolytic abuse, uncomplicated: Secondary | ICD-10-CM | POA: Insufficient documentation

## 2014-02-08 DIAGNOSIS — R6883 Chills (without fever): Secondary | ICD-10-CM | POA: Insufficient documentation

## 2014-02-08 DIAGNOSIS — F1110 Opioid abuse, uncomplicated: Secondary | ICD-10-CM | POA: Insufficient documentation

## 2014-02-08 DIAGNOSIS — Z720 Tobacco use: Secondary | ICD-10-CM | POA: Insufficient documentation

## 2014-02-08 DIAGNOSIS — R110 Nausea: Secondary | ICD-10-CM | POA: Insufficient documentation

## 2014-02-08 DIAGNOSIS — F1910 Other psychoactive substance abuse, uncomplicated: Secondary | ICD-10-CM

## 2014-02-08 DIAGNOSIS — R197 Diarrhea, unspecified: Secondary | ICD-10-CM | POA: Insufficient documentation

## 2014-02-08 DIAGNOSIS — R251 Tremor, unspecified: Secondary | ICD-10-CM | POA: Insufficient documentation

## 2014-02-08 DIAGNOSIS — Z791 Long term (current) use of non-steroidal anti-inflammatories (NSAID): Secondary | ICD-10-CM | POA: Insufficient documentation

## 2014-02-08 DIAGNOSIS — R109 Unspecified abdominal pain: Secondary | ICD-10-CM | POA: Insufficient documentation

## 2014-02-08 LAB — COMPREHENSIVE METABOLIC PANEL
ALBUMIN: 4.1 g/dL (ref 3.5–5.2)
ALT: 162 U/L — AB (ref 0–53)
ANION GAP: 4 — AB (ref 5–15)
AST: 65 U/L — AB (ref 0–37)
Alkaline Phosphatase: 91 U/L (ref 39–117)
BUN: 20 mg/dL (ref 6–23)
CALCIUM: 9.6 mg/dL (ref 8.4–10.5)
CO2: 31 mmol/L (ref 19–32)
Chloride: 106 mmol/L (ref 96–112)
Creatinine, Ser: 0.99 mg/dL (ref 0.50–1.35)
GFR calc Af Amer: >90 mL/min (ref >90)
GFR calc non Af Amer: >90 mL/min (ref >90)
GLUCOSE: 97 mg/dL (ref 70–99)
POTASSIUM: 4.3 mmol/L (ref 3.5–5.1)
SODIUM: 141 mmol/L (ref 135–145)
Total Bilirubin: 1 mg/dL (ref 0.3–1.2)
Total Protein: 7.3 g/dL (ref 6.0–8.3)

## 2014-02-08 LAB — CBC
HCT: 45.4 % (ref 39.0–52.0)
Hemoglobin: 15.3 g/dL (ref 13.0–17.0)
MCH: 30 pg (ref 26.0–34.0)
MCHC: 33.7 g/dL (ref 30.0–36.0)
MCV: 89 fL (ref 78.0–100.0)
PLATELETS: 239 10*3/uL (ref 150–400)
RBC: 5.1 MIL/uL (ref 4.22–5.81)
RDW: 13.3 % (ref 11.5–15.5)
WBC: 7.5 10*3/uL (ref 4.0–10.5)

## 2014-02-08 LAB — RAPID URINE DRUG SCREEN, HOSP PERFORMED
Amphetamines: NOT DETECTED
BENZODIAZEPINES: POSITIVE — AB
Barbiturates: NOT DETECTED
Cocaine: NOT DETECTED
OPIATES: NOT DETECTED
Tetrahydrocannabinol: NOT DETECTED

## 2014-02-08 LAB — SALICYLATE LEVEL: SALICYLATE LVL: 7.2 mg/dL (ref 2.8–20.0)

## 2014-02-08 LAB — ETHANOL: Alcohol, Ethyl (B): <5 mg/dL (ref 0–9)

## 2014-02-08 LAB — ACETAMINOPHEN LEVEL: Acetaminophen (Tylenol), Serum: <10 ug/mL — ABNORMAL LOW (ref 10–30)

## 2014-02-08 MED ORDER — LOPERAMIDE HCL 2 MG PO CAPS
2.0000 mg | ORAL_CAPSULE | Freq: Once | ORAL | Status: AC
  Administered 2014-02-08: 2 mg via ORAL
  Filled 2014-02-08: qty 1

## 2014-02-08 MED ORDER — ONDANSETRON 4 MG PO TBDP
8.0000 mg | ORAL_TABLET | Freq: Once | ORAL | Status: AC
Start: 2014-02-08 — End: 2014-02-08
  Administered 2014-02-08: 8 mg via ORAL
  Filled 2014-02-08: qty 2

## 2014-02-08 MED ORDER — DICYCLOMINE HCL 10 MG PO CAPS
20.0000 mg | ORAL_CAPSULE | Freq: Once | ORAL | Status: AC
  Administered 2014-02-08: 20 mg via ORAL
  Filled 2014-02-08: qty 2

## 2014-02-08 MED ORDER — CLONIDINE HCL 0.1 MG PO TABS
0.1000 mg | ORAL_TABLET | Freq: Once | ORAL | Status: AC
  Administered 2014-02-08: 0.1 mg via ORAL
  Filled 2014-02-08: qty 1

## 2014-02-08 MED ORDER — ONDANSETRON HCL 4 MG/2ML IJ SOLN
4.0000 mg | Freq: Once | INTRAMUSCULAR | Status: DC
  Filled 2014-02-08: qty 2

## 2014-02-08 NOTE — Discharge Instructions (Signed)
Please report to [facility].    Opioid Withdrawal Opioids are a group of narcotic drugs. They include the street drug heroin. They also include pain medicines, such as morphine, hydrocodone, oxycodone, and fentanyl. Opioid withdrawal is a group of characteristic physical and mental signs and symptoms. It typically occurs if you have been using opioids daily for several weeks or longer and stop using or rapidly decrease use. Opioid withdrawal can also occur if you have used opioids daily for a long time and are given a medicine to block the effect.  SIGNS AND SYMPTOMS Opioid withdrawal includes three or more of the following symptoms:   Depressed, anxious, or irritable mood.  Nausea or vomiting.  Muscle aches or spasms.   Watery eyes.   Runny nose.  Dilated pupils, sweating, or hairs standing on end.  Diarrhea or intestinal cramping.  Yawning.   Fever.  Increased blood pressure.  Fast pulse.  Restlessness or trouble sleeping. These signs and symptoms occur within several hours of stopping or reducing short-acting opioids, such as heroin. They can occur within 3 days of stopping or reducing long-acting opioids, such as methadone. Withdrawal begins within minutes of receiving a drug that blocks the effects of opioids, such as naltrexone or naloxone. DIAGNOSIS  Opioid use disorder is diagnosed by your health care provider. You will be asked about your symptoms, drug and alcohol use, medical history, and use of medicines. A physical exam may be done. Lab tests may be ordered. Your health care provider may have you see a mental health professional.  TREATMENT  The treatment for opioid withdrawal is usually provided by medical doctors with special training in substance use disorders (addiction specialists). The following medicines may be included in treatment:  Opioids given in place of the abused opioid. They turn on opioid receptors in the brain and lessen or prevent withdrawal symptoms.  They are gradually decreased (opioid substitution and taper).  Non-opioids that can lessen certain opioid withdrawal symptoms. They may be used alone or with opioid substitution and taper. Successful long-term recovery usually requires medicine, counseling, and group support. HOME CARE INSTRUCTIONS   Take medicines only as directed by your health care provider.  Check with your health care provider before starting new medicines.  Keep all follow-up visits as directed by your health care provider. SEEK MEDICAL CARE IF:  You are not able to take your medicines as directed.  Your symptoms get worse.  You relapse. SEEK IMMEDIATE MEDICAL CARE IF:  You have serious thoughts about hurting yourself or others.  You have a seizure.  You lose consciousness. Document Released: 12/24/2002 Document Revised: 05/07/2013 Document Reviewed: 01/03/2013 [facility] Patient Information 2015 [employer]. This information is not intended to replace advice given to you by your health care provider. Make sure you discuss any questions you have with your health care provider.
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
## 2014-02-08 NOTE — ED Notes (Signed)
Pt here for medical screening for detox from cocaine and heroin. sts he suppose to go to [facility]. sts was told to come here.

## 2014-02-08 NOTE — ED Notes (Signed)
Pt here for medical clearance. Pt appears anxious, jittery, pupils dilated.
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
## 2017-04-07 ENCOUNTER — Ambulatory Visit (HOSPITAL_COMMUNITY)
Admission: EM | Admit: 2017-04-07 | Discharge: 2017-04-07 | Disposition: A | Discharge status: Home / Self Care | Payer: 59 | Attending: Family Medicine | Admitting: Family Medicine

## 2017-04-07 ENCOUNTER — Encounter (HOSPITAL_COMMUNITY): Payer: Self-pay | Admitting: Family Medicine

## 2017-04-07 DIAGNOSIS — Z885 Allergy status to narcotic agent status: Secondary | ICD-10-CM | POA: Insufficient documentation

## 2017-04-07 DIAGNOSIS — F4310 Post-traumatic stress disorder, unspecified: Secondary | ICD-10-CM | POA: Diagnosis not present

## 2017-04-07 DIAGNOSIS — Z833 Family history of diabetes mellitus: Secondary | ICD-10-CM | POA: Diagnosis not present

## 2017-04-07 DIAGNOSIS — Z202 Contact with and (suspected) exposure to infections with a predominantly sexual mode of transmission: Secondary | ICD-10-CM | POA: Diagnosis present

## 2017-04-07 DIAGNOSIS — Z886 Allergy status to analgesic agent status: Secondary | ICD-10-CM | POA: Diagnosis not present

## 2017-04-07 DIAGNOSIS — F17210 Nicotine dependence, cigarettes, uncomplicated: Secondary | ICD-10-CM | POA: Insufficient documentation

## 2017-04-07 DIAGNOSIS — Z8249 Family history of ischemic heart disease and other diseases of the circulatory system: Secondary | ICD-10-CM | POA: Insufficient documentation

## 2017-04-07 DIAGNOSIS — F410 Panic disorder [episodic paroxysmal anxiety] without agoraphobia: Secondary | ICD-10-CM | POA: Diagnosis not present

## 2017-04-07 DIAGNOSIS — Z113 Encounter for screening for infections with a predominantly sexual mode of transmission: Secondary | ICD-10-CM

## 2017-04-07 MED ORDER — METRONIDAZOLE 500 MG PO TABS: 500.0000 mg | ORAL_TABLET | Freq: Two times a day (BID) | ORAL | 0 refills | Status: DC

## 2017-04-07 NOTE — ED Triage Notes (Signed)
Pt here for STD check, [facility]. denies symptoms.

## 2017-04-07 NOTE — Discharge Instructions (Addendum)
We have sent testing for sexually transmitted infections. We will notify you of any positive results once they are received. If required, we will prescribe any medications you might need.  Please refrain from all sexual activity for at least the next seven days.  

## 2017-04-08 ENCOUNTER — Telehealth (HOSPITAL_COMMUNITY): Payer: Self-pay

## 2017-04-08 LAB — URINE CYTOLOGY ANCILLARY ONLY
Chlamydia: NEGATIVE
Neisseria Gonorrhea: NEGATIVE
Trichomonas: NEGATIVE

## 2017-04-08 NOTE — Telephone Encounter (Signed)
Attempted to contact pt regarding results from recent visit being within normal range. No voicemail available.

## 2017-04-11 NOTE — ED Provider Notes (Signed)
[facility]   [id number] 04/07/17 Arrival Time: 1458  ASSESSMENT & PLAN:  1. Possible exposure to STD    Meds ordered this encounter  Medications  . metroNIDAZOLE (FLAGYL) 500 MG tablet    Sig: Take 1 tablet (500 mg total) by mouth 2 (two) times daily.    Dispense:  14 tablet    Refill:  0   Pending: Labs Reviewed  URINE CYTOLOGY ANCILLARY ONLY   Will notify of any positive results. Instructed to refrain from sexual activity for at least seven days.  Reviewed expectations re: course of current medical issues. Questions answered. Outlined signs and symptoms indicating need for more acute intervention. Patient verbalized understanding. After Visit Summary given.   SUBJECTIVE:  Donald Holland is a 27 y.o. male who reports that his girlfriend was recently dx with trichomoniasis. He is without symptoms. Requests testing and treatment. Urinary symptoms: none. Afebrile. No abdominal or pelvic pain. No n/v. No rashes or lesions. Sexually active with single male partner.  ROS: As per HPI.  OBJECTIVE:  Vitals:   04/07/17 1521  BP: 134/78  Pulse: 85  Resp: 18  SpO2: 100%     General appearance: alert, cooperative, appears stated age and no distress Throat: lips, mucosa, and tongue normal; teeth and gums normal Back: no CVA tenderness Abdomen: soft, non-tender; bowel sounds normal; no masses or organomegaly; no guarding or rebound tenderness GU: declines Skin: warm and dry Psychological:  Alert and cooperative. Normal mood and affect.  Results for orders placed or performed during the hospital encounter of 04/07/17  Urine cytology ancillary only  Result Value Ref Range   Chlamydia Negative    Neisseria gonorrhea Negative    Trichomonas Negative     Labs Reviewed  URINE CYTOLOGY ANCILLARY ONLY    Allergies  Allergen Reactions  . Ibuprofen Nausea And Vomiting    ONLY IN HIGH DOSES  . Codeine Hives and Anxiety    Room closing in     Past Medical  History:  Diagnosis Date  . Anxiety   . Panic attack   . PTSD (post-traumatic stress disorder)    Family History  Problem Relation Age of Onset  . Diabetes Father   . Hypertension Father   . Diabetes Maternal Uncle    Social History   Socioeconomic History  . Marital status: Single    Spouse name: Not on file  . Number of children: Not on file  . Years of education: Not on file  . Highest education level: Not on file  Occupational History  . Not on file  Social Needs  . Financial resource strain: Not on file  . Food insecurity:    Worry: Not on file    Inability: Not on file  . Transportation needs:    Medical: Not on file    Non-medical: Not on file  Tobacco Use  . Smoking status: Current Every Day Smoker    Packs/day: 1.00    Types: Cigarettes  Substance and Sexual Activity  . Alcohol use: Yes    Comment: occaisional  . Drug use: No  . Sexual activity: Not on file  Lifestyle  . Physical activity:    Days per week: Not on file    Minutes per session: Not on file  . Stress: Not on file  Relationships  . Social connections:    Talks on phone: Not on file    Gets together: Not on file    Attends religious service: Not on file  Active member of club or organization: Not on file    Attends meetings of clubs or organizations: Not on file    Relationship status: Not on file  . Intimate partner violence:    Fear of current or ex partner: Not on file    Emotionally abused: Not on file    Physically abused: Not on file    Forced sexual activity: Not on file  Other Topics Concern  . Not on file  Social History Narrative  . Not on file          Mardella Layman, MD 04/11/17 [id number]

## 2018-01-05 ENCOUNTER — Encounter (HOSPITAL_COMMUNITY): Payer: Self-pay | Admitting: *Deleted

## 2018-01-05 ENCOUNTER — Other Ambulatory Visit: Payer: Self-pay

## 2018-01-05 ENCOUNTER — Emergency Department (HOSPITAL_COMMUNITY)
Admission: EM | Admit: 2018-01-05 | Discharge: 2018-01-05 | Disposition: A | Discharge status: Home / Self Care | Payer: 59 | Attending: Emergency Medicine | Admitting: Emergency Medicine

## 2018-01-05 DIAGNOSIS — K0889 Other specified disorders of teeth and supporting structures: Secondary | ICD-10-CM | POA: Insufficient documentation

## 2018-01-05 DIAGNOSIS — F17210 Nicotine dependence, cigarettes, uncomplicated: Secondary | ICD-10-CM | POA: Insufficient documentation

## 2018-01-05 DIAGNOSIS — Z79899 Other long term (current) drug therapy: Secondary | ICD-10-CM | POA: Insufficient documentation

## 2018-01-05 MED ORDER — PENICILLIN V POTASSIUM 500 MG PO TABS: 500.0000 mg | ORAL_TABLET | Freq: Three times a day (TID) | ORAL | 0 refills | Status: DC

## 2018-01-05 MED ORDER — HYDROCODONE-ACETAMINOPHEN 5-325 MG PO TABS: 1.0000 | ORAL_TABLET | Freq: Four times a day (QID) | ORAL | 0 refills | Status: DC | PRN

## 2018-01-05 MED ORDER — TRAMADOL HCL 50 MG PO TABS: 50.0000 mg | ORAL_TABLET | Freq: Four times a day (QID) | ORAL | 0 refills | Status: DC | PRN

## 2018-01-05 MED ORDER — TRAMADOL HCL 50 MG PO TABS
50.0000 mg | ORAL_TABLET | Freq: Once | ORAL | Status: AC
  Administered 2018-01-05: 50 mg via ORAL
  Filled 2018-01-05: qty 1

## 2018-01-05 NOTE — Discharge Instructions (Addendum)
Penicillin as prescribed.  Tramadol as prescribed as needed for pain.  Follow-up with dentistry in the next 2 to 3 days.

## 2018-01-05 NOTE — ED Triage Notes (Signed)
Pt c/o left upper dental pain x 3 days; pt states he cannot eat or sleep due to the pain
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
## 2023-06-08 ENCOUNTER — Emergency Department (HOSPITAL_COMMUNITY): Payer: Self-pay

## 2023-06-08 ENCOUNTER — Emergency Department (HOSPITAL_COMMUNITY)
Admission: EM | Admit: 2023-06-08 | Discharge: 2023-06-08 | Disposition: A | Discharge status: Home / Self Care | Payer: Self-pay | Attending: Emergency Medicine | Admitting: Emergency Medicine

## 2023-06-08 ENCOUNTER — Encounter (HOSPITAL_COMMUNITY): Payer: Self-pay | Admitting: *Deleted

## 2023-06-08 ENCOUNTER — Other Ambulatory Visit: Payer: Self-pay

## 2023-06-08 DIAGNOSIS — R4589 Other symptoms and signs involving emotional state: Secondary | ICD-10-CM

## 2023-06-08 DIAGNOSIS — R002 Palpitations: Secondary | ICD-10-CM

## 2023-06-08 DIAGNOSIS — R000 Tachycardia, unspecified: Secondary | ICD-10-CM | POA: Insufficient documentation

## 2023-06-08 DIAGNOSIS — R4689 Other symptoms and signs involving appearance and behavior: Secondary | ICD-10-CM | POA: Insufficient documentation

## 2023-06-08 DIAGNOSIS — D72829 Elevated white blood cell count, unspecified: Secondary | ICD-10-CM | POA: Insufficient documentation

## 2023-06-08 LAB — CBC WITH DIFFERENTIAL/PLATELET
Abs Immature Granulocytes: 0.02 10*3/uL (ref 0.00–0.07)
Basophils Absolute: 0.1 10*3/uL (ref 0.0–0.1)
Basophils Relative: 1 %
Eosinophils Absolute: 0.1 10*3/uL (ref 0.0–0.5)
Eosinophils Relative: 1 %
HCT: 47.2 % (ref 39.0–52.0)
Hemoglobin: 16.1 g/dL (ref 13.0–17.0)
Immature Granulocytes: 0 %
Lymphocytes Relative: 37 %
Lymphs Abs: 4.3 10*3/uL — ABNORMAL HIGH (ref 0.7–4.0)
MCH: 30.1 pg (ref 26.0–34.0)
MCHC: 34.1 g/dL (ref 30.0–36.0)
MCV: 88.4 fL (ref 80.0–100.0)
Monocytes Absolute: 0.6 10*3/uL (ref 0.1–1.0)
Monocytes Relative: 5 %
Neutro Abs: 6.6 10*3/uL (ref 1.7–7.7)
Neutrophils Relative %: 56 %
Platelets: 322 10*3/uL (ref 150–400)
RBC: 5.34 MIL/uL (ref 4.22–5.81)
RDW: 12.6 % (ref 11.5–15.5)
WBC: 11.6 10*3/uL — ABNORMAL HIGH (ref 4.0–10.5)
nRBC: 0 % (ref 0.0–0.2)

## 2023-06-08 LAB — BASIC METABOLIC PANEL WITH GFR
Anion gap: 12 (ref 5–15)
BUN: 8 mg/dL (ref 6–20)
CO2: 24 mmol/L (ref 22–32)
Calcium: 9.7 mg/dL (ref 8.9–10.3)
Chloride: 104 mmol/L (ref 98–111)
Creatinine, Ser: 1.06 mg/dL (ref 0.61–1.24)
GFR, Estimated: >60 mL/min (ref >60)
Glucose, Bld: 96 mg/dL (ref 70–99)
Potassium: 3.9 mmol/L (ref 3.5–5.1)
Sodium: 140 mmol/L (ref 135–145)

## 2023-06-08 LAB — TSH: TSH: 2.098 u[IU]/mL (ref 0.350–4.500)

## 2023-06-08 LAB — TROPONIN I (HIGH SENSITIVITY)
Troponin I (High Sensitivity): <2 ng/L (ref <18)
Troponin I (High Sensitivity): <2 ng/L (ref <18)

## 2023-06-08 LAB — T4, FREE: Free T4: 0.94 ng/dL (ref 0.61–1.12)

## 2023-06-08 LAB — RAPID URINE DRUG SCREEN, HOSP PERFORMED
Amphetamines: NOT DETECTED
Barbiturates: NOT DETECTED
Benzodiazepines: NOT DETECTED
Cocaine: NOT DETECTED
Opiates: NOT DETECTED
Tetrahydrocannabinol: NOT DETECTED

## 2023-06-08 MED ORDER — LORAZEPAM 1 MG PO TABS
1.0000 mg | ORAL_TABLET | Freq: Once | ORAL | Status: AC
  Administered 2023-06-08: 1 mg via ORAL
  Filled 2023-06-08: qty 1

## 2023-06-08 NOTE — ED Provider Notes (Signed)
 [place] EMERGENCY DEPARTMENT AT [facility] Provider Note   CSN: [id number] Arrival date & time: 06/08/23  1334     History Chief Complaint  Patient presents with   Anxiety    Donald Holland is a 33 y.o. male patient who presents to the emergency department today with palpitations being a anxious feeling.  He states this been going on for 6 to 8 weeks.  Initially thought to be anxiety and was started on hydroxyzine which offered no improvement and is now on Klonopin.  He states the Klonopin helps intermittently.  He states he has approximately 1 episode a day however, over the last 2 days he has had 2 episodes each day.  He states that he has a rush of energy and feels like he gets a ringing in his ears with these palpitations.  Also reports associated mild shortness of breath.  No chest pain, tingliness of the hands.  He does state that his father does have SVT in addition to his daughter.   Anxiety       Home Medications Prior to Admission medications   Medication Sig Start Date End Date Taking? Authorizing Provider  clonazePAM (KLONOPIN) 0.5 MG tablet Take 0.25 mg by mouth daily as needed for anxiety. 05/12/23  Yes [provider]      Allergies    Ibuprofen  and Codeine    Review of Systems   Review of Systems  All other systems reviewed and are negative.   Physical Exam Updated Vital Signs BP 121/77   Pulse 86   Temp 98.3 F (36.8 C) (Oral)   Resp 13   Ht 5\' 10"  (1.778 m)   Wt 63.5 kg   SpO2 100%   BMI 20.09 kg/m  Physical Exam Vitals and nursing note reviewed.  Constitutional:      General: He is not in acute distress.    Appearance: Normal appearance.  HENT:     Head: Normocephalic and atraumatic.  Eyes:     General:        Right eye: No discharge.        Left eye: No discharge.  Cardiovascular:     Rate and Rhythm: Regular rhythm. Tachycardia present.     Comments: S1/S2 are distinct without any evidence of murmur, rubs, or gallops.   Radial pulses are 2+ bilaterally.  Dorsalis pedis pulses are 2+ bilaterally.  No evidence of pedal edema. Pulmonary:     Comments: Clear to auscultation bilaterally.  Normal effort.  No respiratory distress.  No evidence of wheezes, rales, or rhonchi heard throughout. Abdominal:     General: Abdomen is flat. Bowel sounds are normal. There is no distension.     Tenderness: There is no abdominal tenderness. There is no guarding or rebound.  Musculoskeletal:        General: Normal range of motion.     Cervical back: Neck supple.  Skin:    General: Skin is warm and dry.     Findings: No rash.  Neurological:     General: No focal deficit present.     Mental Status: He is alert.  Psychiatric:        Mood and Affect: Mood is anxious.        Behavior: Behavior normal.     ED Results / Procedures / Treatments   Labs (all labs ordered are listed, but only abnormal results are displayed) Labs Reviewed  CBC WITH DIFFERENTIAL/PLATELET - Abnormal; Notable for the following components:  Result Value   WBC 11.6 (*)    Lymphs Abs 4.3 (*)    All other components within normal limits  BASIC METABOLIC PANEL WITH GFR  RAPID URINE DRUG SCREEN, HOSP PERFORMED  TSH  T4, FREE  TROPONIN I (HIGH SENSITIVITY)  TROPONIN I (HIGH SENSITIVITY)    EKG EKG Interpretation Date/Time:  Wednesday June 08 2023 14:51:05 EDT Ventricular Rate:  90 PR Interval:  152 QRS Duration:  84 QT Interval:  340 QTC Calculation: 415 R Axis:   70  Text Interpretation: Normal sinus rhythm with sinus arrhythmia Normal ECG When compared with ECG of 21-Aug-2013 14:07, PREVIOUS ECG IS PRESENT Confirmed by Rosealee Concha ([id number]) on 06/08/2023 6:03:16 PM  Radiology DG Chest 2 View Result Date: 06/08/2023 CLINICAL DATA:  Palpitations EXAM: CHEST - 2 VIEW COMPARISON:  10/20/2013 FINDINGS: Frontal and lateral views of the chest demonstrate an unremarkable cardiac silhouette. No acute airspace disease, effusion, or pneumothorax.  No acute bony abnormalities. IMPRESSION: 1. No acute intrathoracic process. Electronically Signed   By: Bobbye Burrow M.D.   On: 06/08/2023 18:05    Procedures Procedures    Medications Ordered in ED Medications  LORazepam (ATIVAN) tablet 1 mg (1 mg Oral Given 06/08/23 1458)    ED Course/ Medical Decision Making/ A&P Clinical Course as of 06/08/23 2108  Wed Jun 08, 2023  1841 On repeat evaluation, patient states he is feeling better after Ativan.  He does state that he has been taking approximately 5 g of kratom every day for a long period of time.  He states that after taking it he does get a rush of energy however, shortly afterwards he started experiencing symptoms in which he presented today.  He states that he has been trying to wean himself off and he is down to 1 g/day.  He does not use any other drugs.  Drank 2 beers yesterday.  Does not drink caffeine. [CF]  2105 Rapid urine drug screen (hospital performed) Negative. [CF]  2106 Troponin I (High Sensitivity) Initial and delta troponin normal. [CF]  2106 CBC with Differential(!) There is some evidence of leukocytosis. [CF]  2106 Basic metabolic panel Negative. [CF]  2106 TSH Normal. [CF]  2106 T4, free Normal. [CF]    Clinical Course User Index [CF] Darletta Ehrich, PA-C   {   Click here for ABCD2, HEART and other calculators  Medical Decision Making Donald Holland is a 33 y.o. male patient who presents to the emergency department today for further evaluation of palpitations and anxious feeling.  Given the patient's family history of SVT I think we should evaluate some labs.  Will get an EKG.  Ativan given with minimal improvement. He is tachycardic but regular.   Patient's heart rate did normalize.  All of his labs are reassuring.  I suspect this is likely secondary to kratom use.  Appropriate sensation was discussed at length.  Patient expressed full understanding.  I have also given him the behavioral health center  address that he can go manage his anxiety symptoms.  Strict turn precautions were discussed.  He is safe for discharge.  Amount and/or Complexity of Data Reviewed Labs: ordered. Decision-making details documented in ED Course. Radiology: ordered.    Final Clinical Impression(s) / ED Diagnoses Final diagnoses:  Palpitations  Feeling anxious    Rx / DC Orders ED Discharge Orders     None         Olympia Bianchi 06/08/23 2108    Adrain Alar,  Royston Cornea, MD 06/08/23 2131

## 2023-06-08 NOTE — ED Triage Notes (Signed)
 Pt is here for evaluation of feelings of heart racing/palpitations.  Pt is tearful in triage and states that he has been seen for same in the past and was told that he has anxiety, he took his clonazepam pta and states that it helped a little bit

## 2023-06-08 NOTE — ED Provider Triage Note (Signed)
 Emergency Medicine Provider Triage Evaluation Note  MOHID FURUYA , a 33 y.o. male  was evaluated in triage.  Pt complains of anxiety.  Pt worried that he is worried that he has svt.  Pt reports having approx to episodes a day  Review of Systems  Positive: anxiety Negative: Fever chills or cough  Physical Exam  BP (!) 144/85   Pulse (!) 117   Temp 98.4 F (36.9 C)   Resp 16   SpO2 100%  Gen:   Awake, no distress   Resp:  Normal effort  MSK:   Moves extremities without difficulty  Other:    Medical Decision Making  Medically screening exam initiated at 2:51 PM.  Appropriate orders placed.  Nkosi Cortright Leppanen was informed that the remainder of the evaluation will be completed by another provider, this initial triage assessment does not replace that evaluation, and the importance of remaining in the ED until their evaluation is complete.     Sandi Crosby, PA-C 06/08/23 1454

## 2023-06-08 NOTE — Discharge Instructions (Signed)
 As we discussed, I highly recommend you reduce and stop taking kratom as this is likely causing your symptoms.  Your labs today are all reassuring.  Have also given you the number for the [facility] behavioral health center when she can go there for your anxiety symptoms without going to your primary care doctor.  You can return to the emergency department for any worsening symptoms.

## 2023-06-09 ENCOUNTER — Ambulatory Visit (HOSPITAL_COMMUNITY)
Admission: EM | Admit: 2023-06-09 | Discharge: 2023-06-09 | Disposition: A | Discharge status: Home / Self Care | Payer: Self-pay | Attending: Nurse Practitioner | Admitting: Nurse Practitioner

## 2023-06-09 DIAGNOSIS — F32A Depression, unspecified: Secondary | ICD-10-CM | POA: Insufficient documentation

## 2023-06-09 DIAGNOSIS — F410 Panic disorder [episodic paroxysmal anxiety] without agoraphobia: Secondary | ICD-10-CM | POA: Insufficient documentation

## 2023-06-09 DIAGNOSIS — G4700 Insomnia, unspecified: Secondary | ICD-10-CM | POA: Insufficient documentation

## 2023-06-09 MED ORDER — PROPRANOLOL HCL 10 MG PO TABS: 10.0000 mg | ORAL_TABLET | Freq: Two times a day (BID) | ORAL | 0 refills | Status: AC

## 2023-06-09 MED ORDER — PROPRANOLOL HCL 10 MG PO TABS: 10.0000 mg | ORAL_TABLET | Freq: Two times a day (BID) | ORAL | Status: DC

## 2023-06-09 NOTE — Discharge Instructions (Addendum)
 Based on the information that you have provided and the presenting issues outpatient services and resources for have been recommended.  It is imperative that you follow through with treatment recommendations within 5-7 days from the of discharge to mitigate further risk to your safety and mental well-being. A list of referrals has been provided below to get you started.  You are not limited to the list provided.  In case of an urgent crisis, you may contact the Mobile Crisis Unit with [facility] at [phone].     Mental Health Services in [facility]     [facility]  Address: [address] [place], [place], [place] [address] Phone Number: [phone]  Fax Number: [phone]    [facility]  Address: [address], [place], [place] [address] Phone: [phone]    [facility] Address: [address], [place], [place] [address] Phone: [phone]    [facility]  Address: [address] [place], [place] [address] Phone:  [phone]    [facility]  Address: [address] [place], [place], [place] [address] Phone: [phone]

## 2023-06-09 NOTE — ED Notes (Signed)
 Patient discharged home. AVS given and reviewed.

## 2023-06-09 NOTE — Progress Notes (Signed)
   06/09/23 1211  [facility] Triage Screening (Walk-ins at [facility] only)  How Did You Hear About Us ? Self  What Is the Reason for Your Visit/Call Today? Donald Holland presents to [facility] voluntarily unaccompanied. Pt states that he has been having panic attacks for the past 6 weeks. Pt states that he has been to the ED 5 times because of the panic attacks. Pt shares that his panic attacks happen once or twice a day, but have been happening everyday. Pt shares that he was at the [facility] last night for his panic attacks. Pt currently denies SI, HI, AVH and alcohol/drug use. Pt states that we can best help him today by giving him a treatment plan.  How Long Has This Been Causing You Problems? 1-6 months  Have You Recently Had Any Thoughts About Hurting Yourself? No  Are You Planning to Commit Suicide/Harm Yourself At This time? No  Have you Recently Had Thoughts About Hurting Someone Marigene Shoulder? No  Are You Planning To Harm Someone At This Time? No  Physical Abuse Denies  Verbal Abuse Denies  Sexual Abuse Denies  Exploitation of patient/patient's resources Denies  Self-Neglect Denies  Are you currently experiencing any auditory, visual or other hallucinations? No  Have You Used Any Alcohol or Drugs in the Past 24 Hours? No  Do you have any current medical co-morbidities that require immediate attention? No  Clinician description of patient physical appearance/behavior: calm, cooperative, pleasant  What Do You Feel Would Help You the Most Today? Social Support  If access to [facility] Urgent Care was not available, would you have sought care in the Emergency Department? Yes  Determination of Need Routine (7 days)  Options For Referral Medication Management;Outpatient Therapy;BH Urgent Care

## 2023-06-09 NOTE — ED Provider Notes (Signed)
 Behavioral Health Urgent Care Medical Screening Exam  Patient Name: Donald Holland MRN: [id number] Date of Evaluation: 06/09/23 Chief Complaint:  panic attacks Diagnosis:  Final diagnoses:  Panic attacks   History of Present illness: Donald Holland is a 33 y.o. male who walked into the behavioral health center today by himself. Per triage: "Donald Holland presents to [facility] voluntarily unaccompanied. Pt states that he has been having panic attacks for the past 6 weeks. Pt states that he has been to the ED 5 times because of the panic attacks. Pt shares that his panic attacks happen once or twice a day, but have been happening everyday. Pt shares that he was at the [facility] last night for his panic attacks. Pt currently denies SI, HI, AVH and alcohol/drug use. 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  He reports that 3 weeks ago, he was driving to work, and had a panic attack rendering him.  At the ER where he was given a prescription for Klonopin 0.5 mg tablets, 10 pills, has been using those nightly as needed or during the day as needed for anxiety.  Patient reports that he currently does not have care established for medication management or therapy, and only has one Klonopin Left.  We talked about the dangers of habit-forming medications in the context of him trying to maintain his sobriety from substances of abuse.  Patient reported that he has tried using hydroxyzine in the past, was unsuccessful.  Writer  educated patient on Inderal for management of anxiety, education, including rationale, benefits, possible side effects of this medication explained, patient receptive to trials.  1 month supply of 10 mg of Inderal twice daily given to patient, with an understanding that we will not be refilling this medication for him, and resources provided for patient to establish care in the community.  He verbalizes understanding.  Pt with flat affect and depressed mood, attention to personal hygiene and grooming is fair, eye contact is good, speech is clear & coherent. Thought contents are organized and logical, and pt currently denies SI/HI/AVH or paranoia. There is no evidence of delusional thoughts.  Patient denies prior mental health related hospitalization, denies a history of suicide attempts, denies. Denies having any medical conditions, shares that he is at a point in his life, where he has no stressors, but anxiety is continuing to be a huge stressor.   Suicide Risk assessment: Mild:  There are no identifiable suicide plans, no associated intent, mild dysphoria and related symptoms, good self-control (both objective and subjective assessment), few other risk factors, and identifiable protective factors, including available and accessible social support.   Flowsheet Row ED from 06/09/2023 in [facility] ED from 06/08/2023 in [facility] Emergency Department at [facility]  C-SSRS RISK CATEGORY No Risk No Risk       Psychiatric Specialty Exam  Presentation  General Appearance:Fairly Groomed  Eye Contact:Fair  Speech:Clear and Coherent  Speech Volume:Normal  Handedness:Right  Mood and Affect  Mood:Depressed  Affect:Congruent   Thought Process  Thought Processes:Coherent  Descriptions of Associations:Intact  Orientation:Full (Time, Place and Person)  Thought Content:Logical    Hallucinations:None  Ideas of Reference:None  Suicidal  Thoughts:No  Homicidal Thoughts:No   Sensorium  Memory:Immediate Good  Judgment:Fair  Insight:Fair   Executive Functions  Concentration:Good  Attention Span:Good  Recall:Good  Fund of Knowledge:Good  Language:Good   Psychomotor Activity  Psychomotor Activity:Normal   Assets  Assets:Desire for Improvement   Sleep  Sleep:Good  Number of hours: No data recorded  Physical Exam: Physical Exam Vitals and nursing note reviewed.  Musculoskeletal:        General: Normal range of motion.     Cervical back: Normal range of motion.  Neurological:     General: No focal deficit present.     Mental Status: He is oriented to person, place, and time.  Psychiatric:        Mood and Affect: Mood normal.        Behavior: Behavior normal.        Thought Content: Thought content normal.        Judgment: Judgment normal.    Review of Systems  Psychiatric/Behavioral:  Positive for depression. Negative for hallucinations, memory loss, substance abuse and suicidal ideas. The patient has insomnia.   All other systems reviewed and are negative.  Blood pressure 118/77, pulse 93, temperature 97.8 F (36.6 C), temperature source Oral, resp. rate 17, SpO2 99%. There is no height or weight on file to calculate BMI.  Musculoskeletal: Strength & Muscle Tone: within normal limits Gait & Station: normal Patient leans: N/A   [facility] MSE Discharge Disposition for Follow up and Recommendations: Based on my evaluation the patient does not appear to have an emergency medical condition and can be discharged with resources and follow up care in outpatient services for Medication Management and Individual Therapy  Patient discharged with recommendations to follow up on the outpatient, provided with a 30-day supply of propranolol IR twice daily 10 mg for anxiety.  Educated on the need to establish care in the community using the resources provided as soon as possible.  Education provided on the  fact that if experiencing worsening of psychiatry symptoms including suicidal ideations, homicidal ideations, or having auditory/visual hallucinations, etc, to call 911, [phone], come back to this location, or go to the nearest ER. Pt verbalized understanding.  Robet Chiquito, NP 06/09/2023, 2:08 PM
